# Patient Record
Sex: Female | Born: 2000 | Race: White | Hispanic: No | Marital: Single | Smoking: Never smoker
Health system: Southern US, Community
[De-identification: ages and names within clinical notes are randomized; demographics above are authoritative.]

---
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
## 2020-10-12 ENCOUNTER — Other Ambulatory Visit: Payer: Self-pay

## 2020-10-12 ENCOUNTER — Emergency Department (HOSPITAL_COMMUNITY)
Admission: EM | Admit: 2020-10-12 | Discharge: 2020-10-12 | Disposition: A | Discharge status: Home / Self Care | Payer: Medicaid Other | Attending: Emergency Medicine | Admitting: Emergency Medicine

## 2020-10-12 ENCOUNTER — Emergency Department (HOSPITAL_COMMUNITY): Payer: Medicaid Other

## 2020-10-12 DIAGNOSIS — F10129 Alcohol abuse with intoxication, unspecified: Secondary | ICD-10-CM | POA: Insufficient documentation

## 2020-10-12 DIAGNOSIS — F10929 Alcohol use, unspecified with intoxication, unspecified: Secondary | ICD-10-CM

## 2020-10-12 DIAGNOSIS — F1010 Alcohol abuse, uncomplicated: Secondary | ICD-10-CM

## 2020-10-12 DIAGNOSIS — R4182 Altered mental status, unspecified: Secondary | ICD-10-CM | POA: Diagnosis present

## 2020-10-12 LAB — COMPREHENSIVE METABOLIC PANEL
ALT: 15 U/L (ref 0–44)
AST: 19 U/L (ref 15–41)
Albumin: 4.4 g/dL (ref 3.5–5.0)
Alkaline Phosphatase: 68 U/L (ref 38–126)
Anion gap: 11 (ref 5–15)
BUN: 9 mg/dL (ref 6–20)
CO2: 25 mmol/L (ref 22–32)
Calcium: 8.8 mg/dL — ABNORMAL LOW (ref 8.9–10.3)
Chloride: 102 mmol/L (ref 98–111)
Creatinine, Ser: 0.59 mg/dL (ref 0.44–1.00)
GFR, Estimated: >60 mL/min (ref >60)
Glucose, Bld: 112 mg/dL — ABNORMAL HIGH (ref 70–99)
Potassium: 3.2 mmol/L — ABNORMAL LOW (ref 3.5–5.1)
Sodium: 138 mmol/L (ref 135–145)
Total Bilirubin: 1.1 mg/dL (ref 0.3–1.2)
Total Protein: 6.5 g/dL (ref 6.5–8.1)

## 2020-10-12 LAB — CBC
HCT: 39.8 % (ref 36.0–46.0)
Hemoglobin: 14.1 g/dL (ref 12.0–15.0)
MCH: 30.8 pg (ref 26.0–34.0)
MCHC: 35.4 g/dL (ref 30.0–36.0)
MCV: 86.9 fL (ref 80.0–100.0)
Platelets: 184 10*3/uL (ref 150–400)
RBC: 4.58 MIL/uL (ref 3.87–5.11)
RDW: 11.8 % (ref 11.5–15.5)
WBC: 9.7 10*3/uL (ref 4.0–10.5)
nRBC: 0 % (ref 0.0–0.2)

## 2020-10-12 LAB — ETHANOL: Alcohol, Ethyl (B): 228 mg/dL — ABNORMAL HIGH (ref <10)

## 2020-10-12 LAB — I-STAT BETA HCG BLOOD, ED (MC, WL, AP ONLY): I-stat hCG, quantitative: <5 m[IU]/mL (ref <5)

## 2020-10-12 LAB — HCG, QUANTITATIVE, PREGNANCY: hCG, Beta Chain, Quant, S: 2 m[IU]/mL (ref <5)

## 2020-10-12 MED ORDER — ONDANSETRON 4 MG PO TBDP: 4.0000 mg | ORAL_TABLET | Freq: Three times a day (TID) | ORAL | 0 refills | Status: AC | PRN

## 2020-10-12 NOTE — ED Notes (Signed)
Pt ambulated per RN request. Pt is steady and did not need help getting in or out of bed. Pt states that she is nauseated.

## 2020-10-12 NOTE — ED Triage Notes (Signed)
Pt presents to ED POV. Per friends pt called friend and she was asleep on floor. Pt vomited multiple times and had heavy ETOH. Pt leaning over in chair in triage. Nods head yes to all questions

## 2020-10-12 NOTE — ED Provider Notes (Signed)
Pt signed out by Dr. Bebe Shaggy pending symptomatic improvement.  Pt is able to ambulate.  She feels nauseous, but does not want any medication for nausea.  She has called for a ride and is ready to go home.  She knows to return if worse.  F/u with pcp.   Jacalyn Lefevre, MD 10/12/20 [phone]

## 2020-10-12 NOTE — ED Notes (Signed)
Pt to CT via stretcher

## 2020-10-12 NOTE — ED Provider Notes (Signed)
[facility] EMERGENCY DEPARTMENT Provider Note   CSN: [id number] Arrival date & time: 10/12/20  0139     History Chief Complaint  Patient presents with  . Altered Mental Status   Level 5 caveat due to altered mental status Erica Fernandez is a 20 y.o. female.  The history is provided by the patient. The history is limited by the condition of the patient.  Altered Mental Status Presenting symptoms: lethargy   Severity:  Severe Most recent episode:  Today Timing:  Constant Progression:  Worsening Chronicity:  New Patient presents with altered mental status.  She arrived via private vehicle.  Apparently patient was found asleep on the floor vomiting.  It is reported the patient had any alcohol abuse.  Patient is not able provide much history.  She does confirm that she has been drinking alcohol heavily, but does not provide any other details       PMH-unknown Soc hx - unknown  OB History   No obstetric history on file.     No family history on file.     Home Medications Prior to Admission medications   Not on File    Allergies    Patient has no known allergies.  Review of Systems   Review of Systems  Unable to perform ROS: Mental status change    Physical Exam Updated Vital Signs BP 104/75 (BP Location: Left Arm)   Pulse 93   Temp (!) 97.5 F (36.4 C) (Oral)   Resp 16   SpO2 98%   Physical Exam CONSTITUTIONAL: Disheveled, lying on her side, moaning HEAD: Normocephalic/atraumatic, no obvious trauma but has dirt and leaves in her hair EYES: EOMI/PERRL ENMT: Mucous membranes moist NECK: supple no meningeal signs SPINE/BACK:entire spine nontender CV: S1/S2 noted, no murmurs/rubs/gallops noted LUNGS: Lungs are clear to auscultation bilaterally, no apparent distress ABDOMEN: soft, nontender NEURO: Pt is lying on her side with eyes closed, but will wake up to voice and answer some questions.  She moves all  extremities EXTREMITIES: pulses normal/equal, no obvious deformities SKIN: warm, color normal PSYCH: Unable to assess  ED Results / Procedures / Treatments   Labs (all labs ordered are listed, but only abnormal results are displayed) Labs Reviewed  COMPREHENSIVE METABOLIC PANEL - Abnormal; Notable for the following components:      Result Value   Potassium 3.2 (*)    Glucose, Bld 112 (*)    Calcium 8.8 (*)    All other components within normal limits  ETHANOL - Abnormal; Notable for the following components:   Alcohol, Ethyl (B) 228 (*)    All other components within normal limits  CBC  RAPID URINE DRUG SCREEN, HOSP PERFORMED  HCG, QUANTITATIVE, PREGNANCY  I-STAT BETA HCG BLOOD, ED ([facility], [facility], [facility] ONLY)    EKG None  Radiology CT Head Wo Contrast  Result Date: 10/12/2020 CLINICAL DATA:  Facial trauma. called friend and she was asleep on floor. Pt vomited multiple times and had heavy ETOH. EXAM: CT HEAD WITHOUT CONTRAST CT CERVICAL SPINE WITHOUT CONTRAST TECHNIQUE: Multidetector CT imaging of the head and cervical spine was performed following the standard protocol without intravenous contrast. Multiplanar CT image reconstructions of the cervical spine were also generated. COMPARISON:  None. FINDINGS: CT HEAD FINDINGS Brain: No evidence of large-territorial acute infarction. No parenchymal hemorrhage. No mass lesion. No extra-axial collection. No mass effect or midline shift. No hydrocephalus. Basilar cisterns are patent. Vascular: No hyperdense vessel. Skull: No acute fracture or focal lesion. Sinuses/Orbits:  Paranasal sinuses and mastoid air cells are clear. The orbits are unremarkable. Other: None. CT CERVICAL SPINE FINDINGS Alignment: Normal. Skull base and vertebrae: No acute fracture. No aggressive appearing focal osseous lesion or focal pathologic process. Soft tissues and spinal canal: No prevertebral fluid or swelling. No visible canal hematoma. Upper chest: Unremarkable. Other:  None. IMPRESSION: 1. No acute intracranial abnormality. 2. No acute displaced fracture or traumatic listhesis of the cervical spine. Electronically Signed   By: Tish Frederickson M.D.   On: 10/12/2020 02:45   CT Cervical Spine Wo Contrast  Result Date: 10/12/2020 CLINICAL DATA:  Facial trauma. called friend and she was asleep on floor. Pt vomited multiple times and had heavy ETOH. EXAM: CT HEAD WITHOUT CONTRAST CT CERVICAL SPINE WITHOUT CONTRAST TECHNIQUE: Multidetector CT imaging of the head and cervical spine was performed following the standard protocol without intravenous contrast. Multiplanar CT image reconstructions of the cervical spine were also generated. COMPARISON:  None. FINDINGS: CT HEAD FINDINGS Brain: No evidence of large-territorial acute infarction. No parenchymal hemorrhage. No mass lesion. No extra-axial collection. No mass effect or midline shift. No hydrocephalus. Basilar cisterns are patent. Vascular: No hyperdense vessel. Skull: No acute fracture or focal lesion. Sinuses/Orbits: Paranasal sinuses and mastoid air cells are clear. The orbits are unremarkable. Other: None. CT CERVICAL SPINE FINDINGS Alignment: Normal. Skull base and vertebrae: No acute fracture. No aggressive appearing focal osseous lesion or focal pathologic process. Soft tissues and spinal canal: No prevertebral fluid or swelling. No visible canal hematoma. Upper chest: Unremarkable. Other: None. IMPRESSION: 1. No acute intracranial abnormality. 2. No acute displaced fracture or traumatic listhesis of the cervical spine. Electronically Signed   By: Tish Frederickson M.D.   On: 10/12/2020 02:45    Procedures Procedures   Medications Ordered in ED Medications - No data to display  ED Course  I have reviewed the triage vital signs and the nursing notes.  Pertinent labs & imaging results that were available during my care of the patient were reviewed by me and considered in my medical decision making (see chart for  details).    MDM Rules/Calculators/A&P                          3:00 AM Patient presents for altered mental status with presumed alcohol intoxication.  Patient apparently has been vomiting.  CT head and C-spine are negative.  No other signs of trauma.  Patient will need to metabolize, and then will be reassessed. 3:40 AM Patient resting comfortably, easily wakes up to voice.  We will continue to monitor 7:12 AM Pt still drowsy/intoxicated Will give food  Signed out to Dr. Particia Nearing  Final Clinical Impression(s) / ED Diagnoses Final diagnoses:  Alcohol abuse  Alcoholic intoxication with complication [facility])    Rx / DC Orders ED Discharge Orders    None       Zadie Rhine, MD 10/12/20 [phone]

## 2020-10-12 NOTE — ED Notes (Signed)
Attempted to ambulate pt. Pt appeared to still be intoxicated. Pt assisted back to stretcher. Pt given [place] sandwich.

## 2021-09-05 IMAGING — CT CT CERVICAL SPINE W/O CM
3 of 4 series · 13 of 33 positions shown, 16 images · non-contrast
Comparison: None.

CLINICAL DATA: Facial trauma. called friend and she was asleep on
floor. Pt vomited multiple times and had heavy ETOH.

EXAM:
CT HEAD WITHOUT CONTRAST
CT CERVICAL SPINE WITHOUT CONTRAST
TECHNIQUE: Multidetector CT imaging of the head and cervical spine was
performed following the standard protocol without intravenous
contrast. Multiplanar CT image reconstructions of the cervical spine
were also generated.

[Series 4: c_spine 2.0 st · axial · 0.32mm/px · z∈[-289,-153]mm · 5 of 104 slices shown, 7 images]
[im 18/104  soft-tissue]
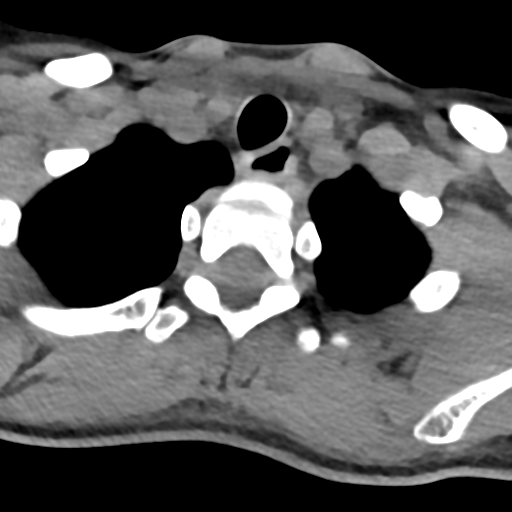
[im 18/104  bone]
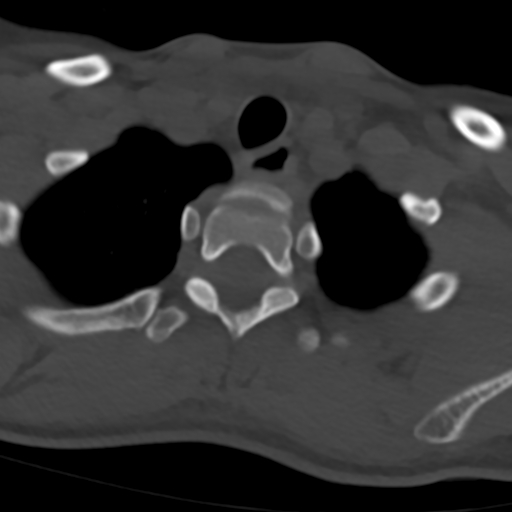
[im 35/104  bone]
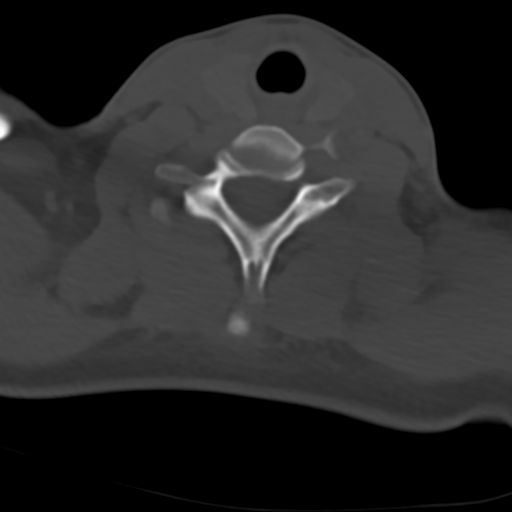
[im 52/104  bone]
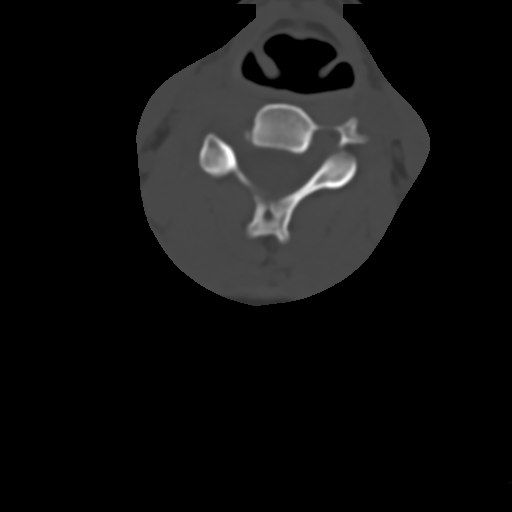
[im 69/104  bone]
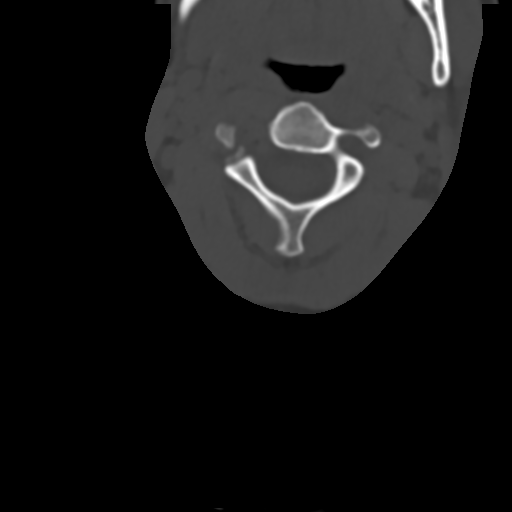
[im 86/104  soft-tissue]
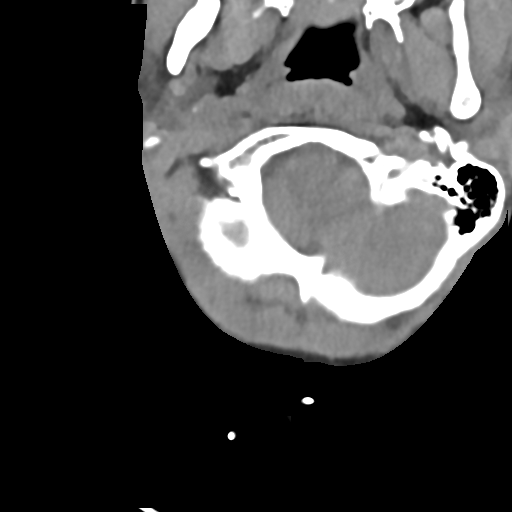
[im 86/104  bone]
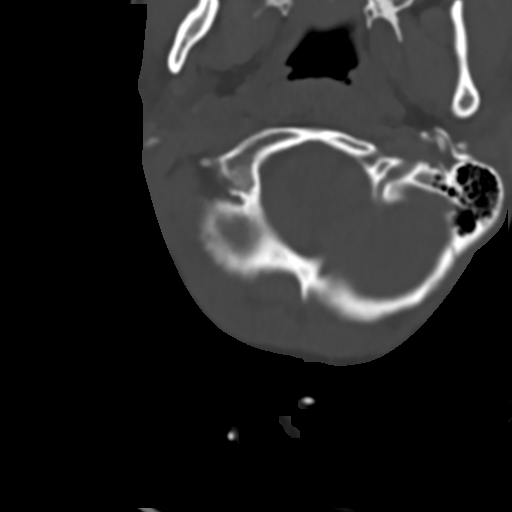

[Series 6: c_spine 2.0 sag bone · sagittal · 0.30mm/px · 5 of 61 slices shown, 6 images]
[im 21/61  bone]
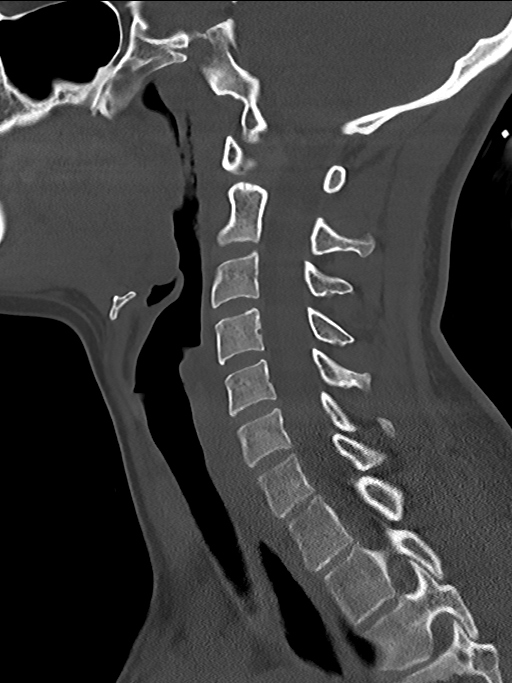
[im 26/61  bone]
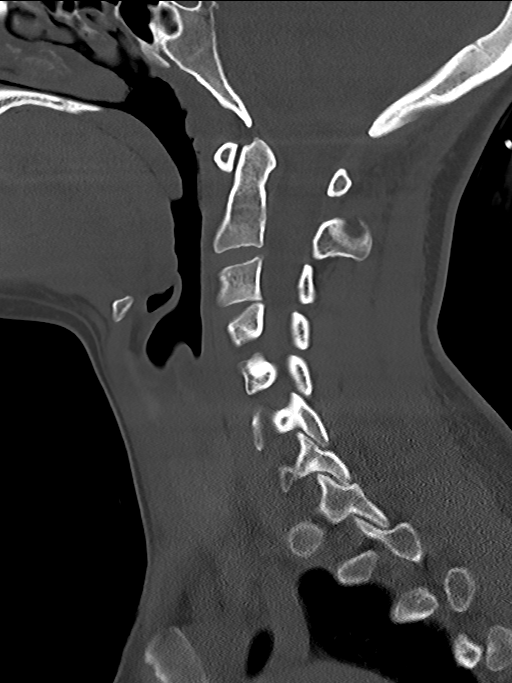
[im 31/61  soft-tissue]
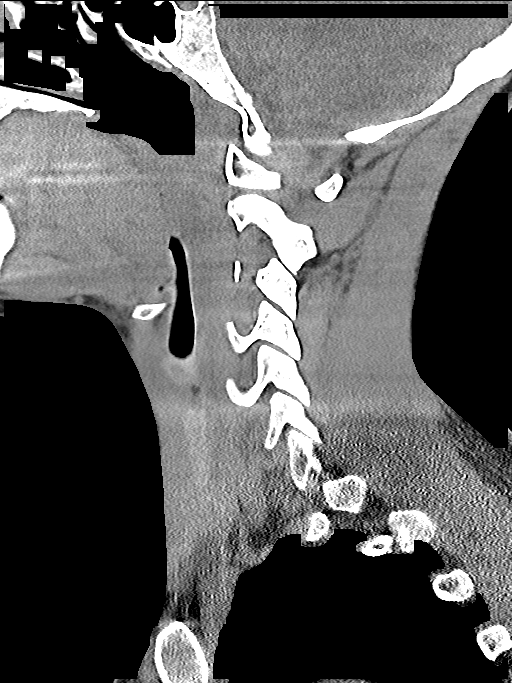
[im 31/61  bone]
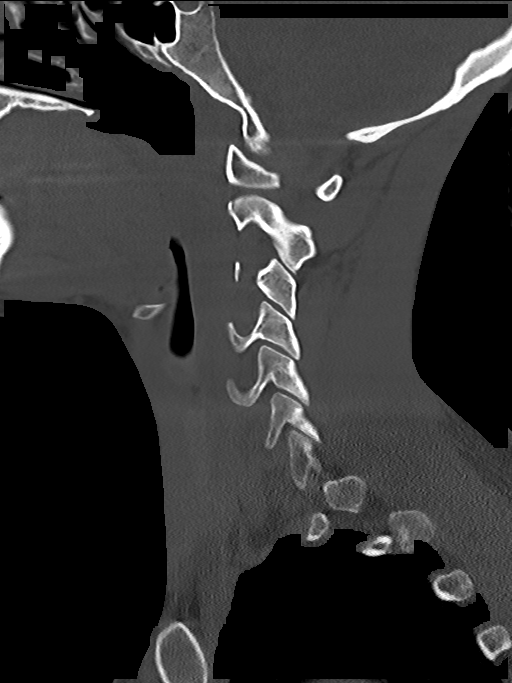
[im 36/61  bone]
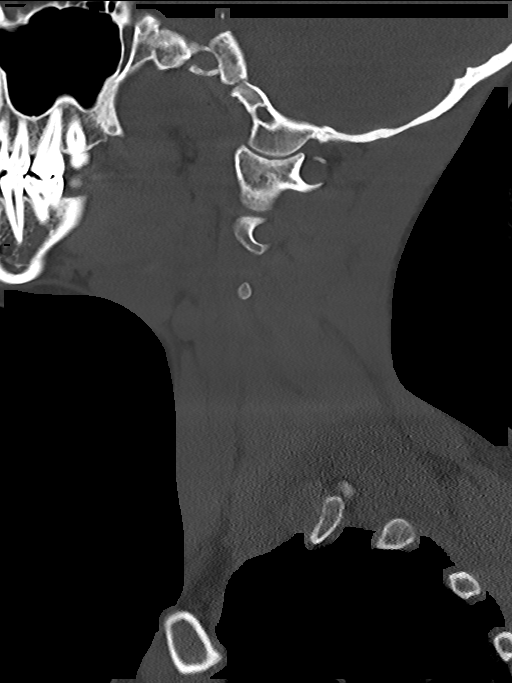
[im 41/61  bone]
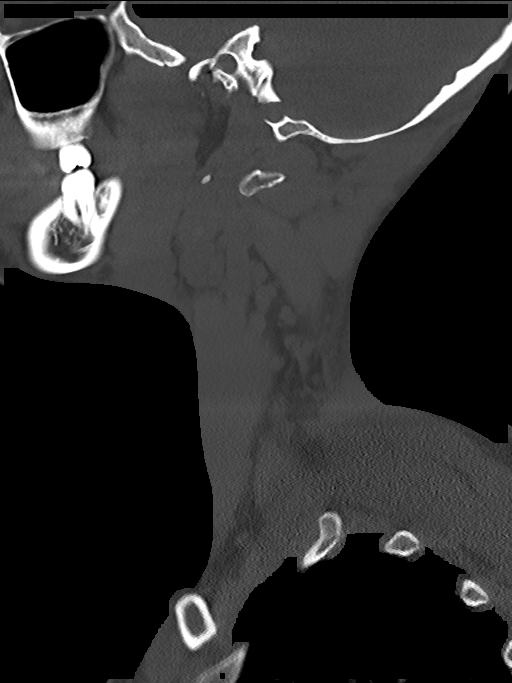

[Series 7: c_spine 2.0 cor bone · coronal · 0.30mm/px · 3 of 61 slices shown]
[im 13/61  bone]
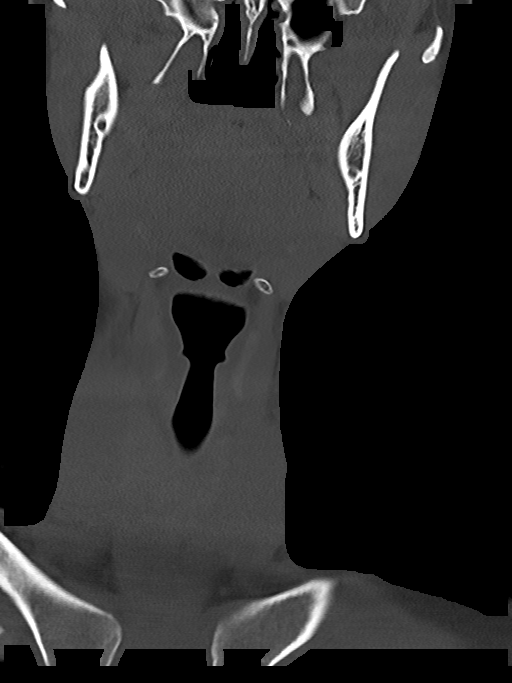
[im 25/61  bone]
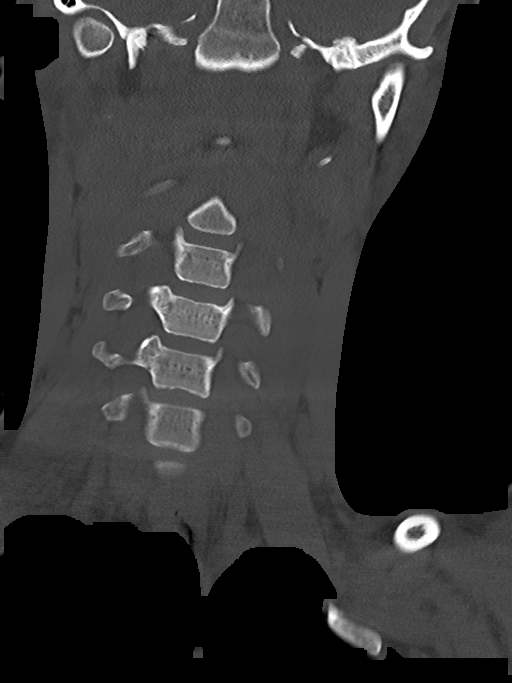
[im 37/61  bone]
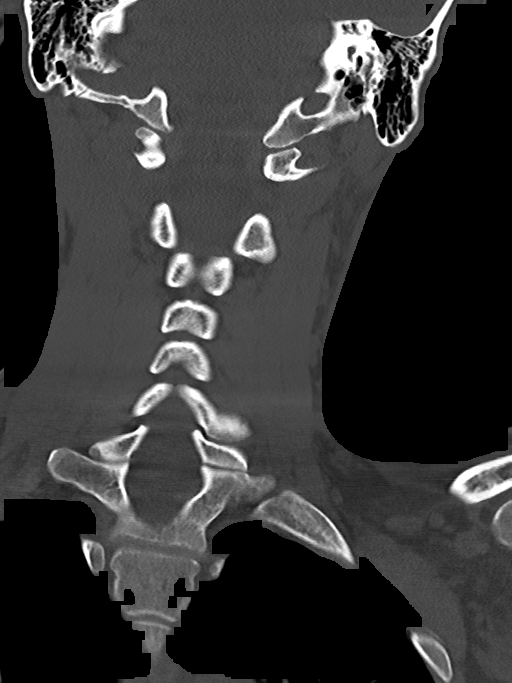

[13 of 33 positions shown; findings below may reference images not displayed]

FINDINGS: CT HEAD FINDINGS

Brain:

No evidence of large-territorial acute infarction. No parenchymal
hemorrhage. No mass lesion. No extra-axial collection.

No mass effect or midline shift. No hydrocephalus. Basilar cisterns
are patent.

Vascular: No hyperdense vessel.

Skull: No acute fracture or focal lesion.

Sinuses/Orbits: Paranasal sinuses and mastoid air cells are clear.
The orbits are unremarkable.

Other: None.

CT CERVICAL SPINE FINDINGS

Alignment: Normal.

Skull base and vertebrae: No acute fracture. No aggressive appearing
focal osseous lesion or focal pathologic process.

Soft tissues and spinal canal: No prevertebral fluid or swelling. No
visible canal hematoma.

Upper chest: Unremarkable.

Other: None.
IMPRESSION: 1. No acute intracranial abnormality.
2. No acute displaced fracture or traumatic listhesis of the
cervical spine.
